# Patient Record
Sex: Female | Born: 1985 | Race: Black or African American | Hispanic: No | Marital: Single | Smoking: Current every day smoker
Health system: Southern US, Community
[De-identification: ages and names within clinical notes are randomized; demographics above are authoritative.]

---

## 2014-09-23 ENCOUNTER — Emergency Department (HOSPITAL_COMMUNITY)
Admission: EM | Admit: 2014-09-23 | Discharge: 2014-09-23 | Disposition: A | Discharge status: Home / Self Care | Payer: Self-pay | Attending: Emergency Medicine | Admitting: Emergency Medicine

## 2014-09-23 ENCOUNTER — Encounter (HOSPITAL_COMMUNITY): Payer: Self-pay | Admitting: Emergency Medicine

## 2014-09-23 ENCOUNTER — Emergency Department (HOSPITAL_COMMUNITY): Payer: Self-pay

## 2014-09-23 DIAGNOSIS — Z3202 Encounter for pregnancy test, result negative: Secondary | ICD-10-CM | POA: Insufficient documentation

## 2014-09-23 DIAGNOSIS — R51 Headache: Secondary | ICD-10-CM | POA: Insufficient documentation

## 2014-09-23 DIAGNOSIS — J029 Acute pharyngitis, unspecified: Secondary | ICD-10-CM | POA: Insufficient documentation

## 2014-09-23 DIAGNOSIS — R0982 Postnasal drip: Secondary | ICD-10-CM | POA: Insufficient documentation

## 2014-09-23 DIAGNOSIS — N390 Urinary tract infection, site not specified: Secondary | ICD-10-CM | POA: Insufficient documentation

## 2014-09-23 LAB — COMPREHENSIVE METABOLIC PANEL
ALT: 9 U/L (ref 0–35)
ANION GAP: 11 (ref 5–15)
AST: 13 U/L (ref 0–37)
Albumin: 2.7 g/dL — ABNORMAL LOW (ref 3.5–5.2)
Alkaline Phosphatase: 64 U/L (ref 39–117)
BUN: 7 mg/dL (ref 6–23)
CALCIUM: 7.6 mg/dL — AB (ref 8.4–10.5)
CHLORIDE: 103 meq/L (ref 96–112)
CO2: 21 meq/L (ref 19–32)
Creatinine, Ser: 1 mg/dL (ref 0.50–1.10)
GFR calc Af Amer: 87 mL/min — ABNORMAL LOW (ref >90)
GFR, EST NON AFRICAN AMERICAN: 75 mL/min — AB (ref >90)
Glucose, Bld: 94 mg/dL (ref 70–99)
Potassium: 3.4 mEq/L — ABNORMAL LOW (ref 3.7–5.3)
Sodium: 135 mEq/L — ABNORMAL LOW (ref 137–147)
Total Bilirubin: 0.2 mg/dL — ABNORMAL LOW (ref 0.3–1.2)
Total Protein: 6.2 g/dL (ref 6.0–8.3)

## 2014-09-23 LAB — CBC WITH DIFFERENTIAL/PLATELET
BASOS PCT: 0 % (ref 0–1)
Basophils Absolute: 0 10*3/uL (ref 0.0–0.1)
EOS PCT: 1 % (ref 0–5)
Eosinophils Absolute: 0.2 10*3/uL (ref 0.0–0.7)
HEMATOCRIT: 34.2 % — AB (ref 36.0–46.0)
HEMOGLOBIN: 11.1 g/dL — AB (ref 12.0–15.0)
Lymphocytes Relative: 9 % — ABNORMAL LOW (ref 12–46)
Lymphs Abs: 2.1 10*3/uL (ref 0.7–4.0)
MCH: 30.4 pg (ref 26.0–34.0)
MCHC: 32.5 g/dL (ref 30.0–36.0)
MCV: 93.7 fL (ref 78.0–100.0)
MONOS PCT: 8 % (ref 3–12)
Monocytes Absolute: 2 10*3/uL — ABNORMAL HIGH (ref 0.1–1.0)
NEUTROS ABS: 20.6 10*3/uL — AB (ref 1.7–7.7)
Neutrophils Relative %: 82 % — ABNORMAL HIGH (ref 43–77)
Platelets: 318 10*3/uL (ref 150–400)
RBC: 3.65 MIL/uL — ABNORMAL LOW (ref 3.87–5.11)
RDW: 13.5 % (ref 11.5–15.5)
WBC: 24.9 10*3/uL — ABNORMAL HIGH (ref 4.0–10.5)

## 2014-09-23 LAB — URINALYSIS, ROUTINE W REFLEX MICROSCOPIC
Glucose, UA: 100 mg/dL — AB
KETONES UR: 40 mg/dL — AB
Nitrite: POSITIVE — AB
PROTEIN: 100 mg/dL — AB
Specific Gravity, Urine: 1.016 (ref 1.005–1.030)
Urobilinogen, UA: 2 mg/dL — ABNORMAL HIGH (ref 0.0–1.0)
pH: 5 (ref 5.0–8.0)

## 2014-09-23 LAB — PREGNANCY, URINE: PREG TEST UR: NEGATIVE

## 2014-09-23 LAB — RAPID STREP SCREEN (MED CTR MEBANE ONLY): Streptococcus, Group A Screen (Direct): NEGATIVE

## 2014-09-23 LAB — URINE MICROSCOPIC-ADD ON

## 2014-09-23 MED ORDER — CIPROFLOXACIN HCL 500 MG PO TABS: 500.0000 mg | ORAL_TABLET | Freq: Two times a day (BID) | ORAL | Status: AC

## 2014-09-23 MED ORDER — HYDROCODONE-ACETAMINOPHEN 5-325 MG PO TABS: 2.0000 | ORAL_TABLET | ORAL | Status: AC | PRN

## 2014-09-23 MED ORDER — KETOROLAC TROMETHAMINE 30 MG/ML IJ SOLN
30.0000 mg | Freq: Once | INTRAMUSCULAR | Status: AC
  Administered 2014-09-23: 30 mg via INTRAVENOUS
  Filled 2014-09-23: qty 1

## 2014-09-23 MED ORDER — DEXTROSE 5 % IV SOLN
1.0000 g | Freq: Once | INTRAVENOUS | Status: AC
  Administered 2014-09-23: 1 g via INTRAVENOUS
  Filled 2014-09-23: qty 10

## 2014-09-23 MED ORDER — SODIUM CHLORIDE 0.9 % IV SOLN
Freq: Once | INTRAVENOUS | Status: AC
  Administered 2014-09-23: 13:00:00 via INTRAVENOUS

## 2014-09-23 MED ORDER — METOCLOPRAMIDE HCL 5 MG/ML IJ SOLN
10.0000 mg | Freq: Once | INTRAMUSCULAR | Status: AC
  Administered 2014-09-23: 10 mg via INTRAVENOUS
  Filled 2014-09-23: qty 2

## 2014-09-23 MED ORDER — DIPHENHYDRAMINE HCL 50 MG/ML IJ SOLN
25.0000 mg | Freq: Once | INTRAMUSCULAR | Status: AC
  Administered 2014-09-23: 25 mg via INTRAVENOUS
  Filled 2014-09-23: qty 1

## 2014-09-23 NOTE — Progress Notes (Signed)
  CARE MANAGEMENT ED NOTE 09/23/2014  Patient:  Snelgrove,Chanci   Account Number:  [id number]  Date Initiated:  09/23/2014  Documentation initiated by:  Radford PaxFERRERO,Carsin Randazzo  Subjective/Objective Assessment:   Patient presents to Ed with neck pain, nasal congestion and headache     Subjective/Objective Assessment Detail:     Action/Plan:   Action/Plan Detail:   Anticipated DC Date:  09/23/2014     Status Recommendation to Physician:   Result of Recommendation:    Other ED Services  Consult Working Plan    DC Planning Services  Other  PCP issues    Choice offered to / List presented to:            Status of service:  Completed, signed off  ED Comments:   ED Comments Detail:  EDCM spoke to patient at bedside. Patient confirms she does not have a pcp or insurance [place] in [place]. EDCM provide patient with pamphlet to [facility], informed patient of services there and walk in times.  EDCM also provided patient with list of pcps who accept self pay patients, list of discount pharmacies and websites needymeds.org and GoodRX.com for medication assistance, phone number to inquire about the orange card, phone number to inquire about Mediciad, phone number to inquire about the Affordable Care Act, financial resources in the community such as local churches, salvation army, urban ministries, and dental assistance for uninsured patients. Patient thankfulf or resources.  No further EDCM needs at this time.  Referral to [facility] made.

## 2014-09-23 NOTE — ED Provider Notes (Signed)
Medical screening examination/treatment/procedure(s) were performed by non-physician practitioner and as supervising physician I was immediately available for consultation/collaboration.   EKG Interpretation None        Shon Batonourtney F Horton, MD 09/23/14 1902

## 2014-09-23 NOTE — ED Notes (Signed)
Patient transported to X-ray 

## 2014-09-23 NOTE — Discharge Instructions (Signed)
Urinary Tract Infection °Urinary tract infections (UTIs) can develop anywhere along your urinary tract. Your urinary tract is your body's drainage system for removing wastes and extra water. Your urinary tract includes two kidneys, two ureters, a bladder, and a urethra. Your kidneys are a pair of bean-shaped organs. Each kidney is about the size of your fist. They are located below your ribs, one on each side of your spine. °CAUSES °Infections are caused by microbes, which are microscopic organisms, including fungi, viruses, and bacteria. These organisms are so small that they can only be seen through a microscope. Bacteria are the microbes that most commonly cause UTIs. °SYMPTOMS  °Symptoms of UTIs may vary by age and gender of the patient and by the location of the infection. Symptoms in young women typically include a frequent and intense urge to urinate and a painful, burning feeling in the bladder or urethra during urination. Older women and men are more likely to be tired, shaky, and weak and have muscle aches and abdominal pain. A fever may mean the infection is in your kidneys. Other symptoms of a kidney infection include pain in your back or sides below the ribs, nausea, and vomiting. °DIAGNOSIS °To diagnose a UTI, your caregiver will ask you about your symptoms. Your caregiver also will ask to provide a urine sample. The urine sample will be tested for bacteria and white blood cells. White blood cells are made by your body to help fight infection. °TREATMENT  °Typically, UTIs can be treated with medication. Because most UTIs are caused by a bacterial infection, they usually can be treated with the use of antibiotics. The choice of antibiotic and length of treatment depend on your symptoms and the type of bacteria causing your infection. °HOME CARE INSTRUCTIONS °· If you were prescribed antibiotics, take them exactly as your caregiver instructs you. Finish the medication even if you feel better after you  have only taken some of the medication. °· Drink enough water and fluids to keep your urine clear or pale yellow. °· Avoid caffeine, tea, and carbonated beverages. They tend to irritate your bladder. °· Empty your bladder often. Avoid holding urine for long periods of time. °· Empty your bladder before and after sexual intercourse. °· After a bowel movement, women should cleanse from front to back. Use each tissue only once. °SEEK MEDICAL CARE IF:  °· You have back pain. °· You develop a fever. °· Your symptoms do not begin to resolve within 3 days. °SEEK IMMEDIATE MEDICAL CARE IF:  °· You have severe back pain or lower abdominal pain. °· You develop chills. °· You have nausea or vomiting. °· You have continued burning or discomfort with urination. °MAKE SURE YOU:  °· Understand these instructions. °· Will watch your condition. °· Will get help right away if you are not doing well or get worse. °Document Released: 09/01/2005 Document Revised: 05/23/2012 Document Reviewed: 12/31/2011 °ExitCare® Patient Information ©2015 [employer]. This information is not intended to replace advice given to you by your health care provider. Make sure you discuss any questions you have with your health care provider. ° ° °Emergency Department Resource Guide °1) Find a Doctor and Pay Out of Pocket °Although you won't have to find out who is covered by your insurance plan, it is a good idea to ask around and get recommendations. You will then need to call the office and see if the doctor you have chosen will accept you as a new patient and what types of   options they offer for patients who are self-pay. Some doctors offer discounts or will set up payment plans for their patients who do not have insurance, but you will need to ask so you aren't surprised when you get to your appointment. ° °2) Contact Your Local Health Department °Not all health departments have doctors that can see patients for sick visits, but many do, so it is worth  a call to see if yours does. If you don't know where your local health department is, you can check in your phone book. The CDC also has a tool to help you locate your state's health department, and many state websites also have listings of all of their local health departments. ° °3) Find a Walk-in Clinic °If your illness is not likely to be very severe or complicated, you may want to try a walk in clinic. These are popping up all over the country in pharmacies, drugstores, and shopping centers. They're usually staffed by nurse practitioners or physician assistants that have been trained to treat common illnesses and complaints. They're usually fairly quick and inexpensive. However, if you have serious medical issues or chronic medical problems, these are probably not your best option. ° °No Primary Care Doctor: °- Call Health Connect at  832-8000 - they can help you locate a primary care doctor that  accepts your insurance, provides certain services, etc. °- Physician Referral Service- 1-800-533-3463 ° °Chronic Pain Problems: °Organization         Address  Phone   Notes  °[facility]  (336) 297-2271 Patients need to be referred by their primary care doctor.  ° °Medication Assistance: °Organization         Address  Phone   Notes  °[place] [facility] [address] °[place], [place] [address] (336) 641-8030 --Must be a resident of [place] °-- Must have NO insurance coverage whatsoever (no Medicaid/ Medicare, etc.) °-- The pt. MUST have a primary care doctor that directs their care regularly and follows them in the community °  °[facility]  (866) 331-1348   °[employer]  (888) 892-1162   ° °Agencies that provide inexpensive medical care: °Organization         Address  Phone   Notes  °[facility]  (336) 832-8035   °[facility]    (336) 832-7272   °[facility] [address] °[place], [place]  [address] (336) 832-4777   °[facility] [address], °[place] (336) 271-4999   °[facility]    (336) 373-0678   °[facility]    (336) 272-1050   °[facility] ° [address], [place] Phone:  (336) 832-4444, Fax:  (336) 832-4440 Hours of Operation:  9 am - 6 pm, M-F.  Also accepts Medicaid/Medicare and self-pay.  °[facility] ° [address], [place] Phone: (336) 832-3150, Fax: (336) 832-3151. Hours of Operation:  8:30 am - 5:30 pm, M-F.  Also accepts Medicaid and self-pay.  °[facility] [place] [address], [place] Phone: (336) 878-6027   °[facility] [address], [place],  (336)723-1848, Ext. 123 Mondays & Thursdays: 7-9 AM.  First 15 patients are seen on a first come, first serve basis. °  ° °Medicaid-accepting [place] Providers: ° °Organization         Address  Phone   Notes  °  [facility] [address], [place] (336) 641-2100 Also accepts self-pay patients.  °[facility] [address], [place] ° (336) 856-9996   °[facility] [address], [place] (336) 288-8857   °[facility] [address] [place] [address], [place] (336) 299-7000   °Veita Bland [address], [place]  ° (336) 373-1557 Only accepts [place] Access Medicaid patients after they have their name applied to their card.  ° °Self-Pay (no insurance) in [place]: ° °Organization         Address  Phone   Notes  °Sickle Cell Patients, [facility] [address], [place] (336) 832-1970   °[facility] [address], [place] (336) 832-4400   ° [facility] ° [address], [place] (336) 992-4800   °[facility]/Dr. Osei-Bonsu ° [address] [place] [address], [place] or [address], [place] (336) 841-8500 Phone number for both [place] and [place] locations is the same.  °[facility] [address], [place] (336) 299-0000   °[facility] [address] [place] [address], [place] or [address] (336) 852-7530 °(336) 878-2260   °[facility] [address], [place] (336) 350-1642, phone; (336) 294-5005, fax Sees patients 1st and 3rd Saturday of every month.  Must not qualify for public or private insurance (i.e. Medicaid, Medicare, [place] Health Choice, Veterans' Benefits) • Household income should be no more than 200% of the poverty level •The clinic cannot treat you if you are pregnant or think you are pregnant • Sexually transmitted diseases are not treated at the clinic.  ° ° °Dental Care: °Organization         Address  Phone  Notes  °[place] [facility] [address], [place] (336) 641-6152 Accepts children up to age 21 who are enrolled in Medicaid or [place] Health Choice; pregnant women with a Medicaid card; and children who have applied for Medicaid or [place] Health Choice, but were declined, whose parents can pay a reduced fee at time of service.  °[place] [facility] [place]  [address], [place] (336) 641-7733 Accepts children up to age 21 who are enrolled in Medicaid or [place] Health Choice; pregnant women with a Medicaid card; and children who have applied for Medicaid or [place] Health Choice, but were declined, whose parents can pay a reduced fee at time of service.  °[facility] ° [address],  (336) 641-4533 Patients are seen by appointment only. Walk-ins are not accepted. [facility] will see patients 18 years of age and older. °Monday - Tuesday (8am-5pm) °Most Wednesdays (8:30-5pm) °$30 per visit, cash only  °[facility] ° [address], [place] (336) 641-4533  Patients are seen by appointment only. Walk-ins are not accepted. [facility] will see patients 18 years of age and older. °One Wednesday Evening (Monthly: Volunteer Based).  $30 per visit, cash only  °[facility]  (919) 537-3737 for adults; Children under age 4, call Graduate Pediatric Dentistry at (919) [phone]. Children aged 4-14, please call (919) 537-3737 to request a pediatric application. ° Dental services are provided in all areas of dental care including fillings, crowns and bridges, complete and partial   dentures, implants, gum treatment, root canals, and extractions. Preventive care is also provided. Treatment is provided to both adults and children. °Patients are selected via a lottery and there is often a waiting list. °  °[facility] [address], °[place] ° (336) 763-8833 www.drcivils.com °  °[facility] [address], [place], [place] (336)723-1848, Ext. 123 Second and Fourth Thursday of each month, opens at 6:30 AM; Clinic ends at 9 AM.  Patients are seen on a first-come first-served basis, and a limited number are seen during each clinic.  ° °[facility] ° [address], [place], [place] (336) 723-7904   Eligibility Requirements °You must have lived in [place], or [place] counties for at least the last three months. °  You cannot be eligible for state or federal sponsored healthcare insurance, including Veterans Administration, Medicaid, or Medicare. °  You generally cannot be eligible for healthcare insurance through your employer.  °  How to apply: °Eligibility screenings are held every Tuesday and Wednesday afternoon from 1:00 pm until 4:00 pm. You do not need an appointment for the interview!  °[facility] [address], [place], [place] 336-631-2330   °[facility] [facility]  336-342-8273   °[facility]  336-703-3100   °[facility]   336-570-6415   ° °Behavioral Health Resources in the Community: °Intensive Outpatient Programs °Organization         Address  Phone  Notes  °[facility] [address], [place], [place] 336-878-6098   °[facility] [address], [place], [place] 336-832-9800   °[facility] [address], [place], [place] ° 336-882-2125   °[facility] [address],  °[place], [place] 1-800-853-5163 or 336-641-4981   °Substance Abuse Resources °Organization         Address  Phone  Notes  °[facility]  336-882-2125   °[facility]  336-784-9470   °[facility]  336-285-9073   °[facility]  336-845-3988   °Residential & Outpatient Substance Abuse Program  1-800-659-3381   °Psychological Services °Organization         Address  Phone  Notes  °[facility]  336- 832-9600   °[facility]  336- 378-7881   °[facility] [address], [place] 1-800-853-5163 or 336-641-4981   ° °Mobile Crisis Teams °Organization         Address  Phone  Notes  °[facility], Mobile Crisis Care Unit  1-877-626-1772   °Assertive °[facility] ° [address] [place], [place] 336-834-9664   °Sharon DeEsch [address] °[place] 336-554-5454   ° °Self-Help/Support Groups °Organization         Address  Phone             Notes  °[facility] - variety of support groups  336- 373-1402 Call for more information  °Narcotics Anonymous (NA), [facility] [address], °[place]  2 meetings at this location  ° °Residential Treatment Programs °Organization         Address  Phone  Notes  °[facility] [address],    °[place]   1-866-801-8205   °[facility] ° [address], [place],  704-293-8524   °[facility] [address], [place] 336-845-3988 Admissions: 8am-3pm M-F   °  [facility] [address]    °[place], [place] 336-841-1104   °[facility] [address], [place], [place] 336-379-7146   °[facility] [address]  °[place], [place] 336-285-9073   °[facility] Intensive Outpatient [address], [place], [place] 336-852-3033   °[facility] [address]  °[place], [place] 1-877-615-2722 or 336-784-9470   °[facility] [address] [place], [place] 336-227-7417 Accepts Medicaid  °[facility] [address]  °[place] 1-800-659-3381 Substance Abuse/Addiction Treatment  ° °[place] Behavioral Health Resources °Organization         Address  Phone  Notes  °[facility]  (888) 581-9988   °Julie Brannon, PhD [address] [place], [place]   (336) 349-5553 or (336) 951-0000   °[facility]   [address] °[place], [place] (336) 349-4454   °[facility] [address], [place], [place] (336) 342-8316 Insurance/Medicaid/sponsorship through [facility]  °[facility] [address]                                    [place], [place] (336) 342-8316 Therapy/tele-psych/case  °[facility] [address]  ° [place], [place] (336) 349-2233    °Dr. Arfeen  (336) 349-4544   °[facility] [place]  [employer] [place] [facility] 1) [address], [place] °2) [address], [place] °3)  [address], [place] (336) 349-3220 °(336) 342-7768 ° °(336) 342-8140   °[place] [facility] (336) 342-1394 or (336) 342-3537 (After Hours)    ° ° ° ° °

## 2014-09-23 NOTE — ED Provider Notes (Signed)
CSN: [id number]     Arrival date & time 09/23/14  1140 History   First MD Initiated Contact with Patient 09/23/14 1234     Chief Complaint  Patient presents with  . Neck Pain  . Headache  . Nasal Congestion     (Consider location/radiation/quality/duration/timing/severity/associated sxs/prior Treatment) Patient is a 28 y.o. female presenting with headaches. The history is provided by the patient. No language interpreter was used.  Headache Pain location:  Generalized Quality:  Stabbing Radiates to:  Does not radiate Severity currently:  6/10 Severity at highest:  6/10 Onset quality:  Gradual Duration:  1 day Timing:  Constant Progression:  Worsening Chronicity:  New Similar to prior headaches: yes   Context: not activity   Relieved by:  Nothing Worsened by:  Nothing tried Ineffective treatments:  None tried Associated symptoms: cough, drainage and sore throat   Associated symptoms: no abdominal pain   Risk factors: no anger     History reviewed. No pertinent past medical history. History reviewed. No pertinent past surgical history. No family history on file. History  Substance Use Topics  . Smoking status: Current Every Day Smoker  . Smokeless tobacco: Not on file  . Alcohol Use: No   OB History   Grav Para Term Preterm Abortions TAB SAB Ect Mult Living                 Review of Systems  HENT: Positive for postnasal drip and sore throat.   Respiratory: Positive for cough.   Gastrointestinal: Negative for abdominal pain.  Neurological: Positive for headaches.  All other systems reviewed and are negative.     Allergies  Review of patient's allergies indicates no known allergies.  Home Medications   Prior to Admission medications   Medication Sig Start Date End Date Taking? Authorizing Provider  Pseudoeph-Doxylamine-DM-APAP (NYQUIL PO) Take 2 capsules by mouth daily as needed (congestion).   Yes Historical Provider, MD   BP 143/103  Pulse 114   Temp(Src) 99.7 F (37.6 C) (Oral)  Resp 20  SpO2 98%  LMP 09/23/2014 Physical Exam  Nursing note and vitals reviewed. Constitutional: She is oriented to person, place, and time. She appears well-developed and well-nourished.  HENT:  Head: Normocephalic and atraumatic.  Eyes: Conjunctivae and EOM are normal. Pupils are equal, round, and reactive to light.  Neck: Normal range of motion.  Cardiovascular: Normal rate, regular rhythm and normal heart sounds.   Pulmonary/Chest: Effort normal.  Abdominal: Soft. She exhibits no distension.  Musculoskeletal: Normal range of motion.  Neurological: She is alert and oriented to person, place, and time.  Skin: Skin is warm.  Psychiatric: She has a normal mood and affect.    ED Course  Procedures (including critical care time) Labs Review Labs Reviewed - No data to display  Imaging Review No results found.   EKG Interpretation None      MDM   Final diagnoses:  UTI (lower urinary tract infection)    IV Rocephin Rx cipro/hydrocodone Resource guide 24 hour recheck if not improved     Elson AreasLeslie K Sofia, PA-C 09/23/14 1530  Lonia SkinnerLeslie K BlandvilleSofia, PA-C 09/23/14 1530

## 2014-09-23 NOTE — ED Notes (Signed)
Per pt, states flu like symptoms which started yesterday-neck pain with headache-nasal congestion/drainage-achey all over

## 2014-09-24 LAB — URINE CULTURE
Colony Count: NO GROWTH
Culture: NO GROWTH
Special Requests: NORMAL

## 2014-09-25 LAB — CULTURE, GROUP A STREP

## 2016-09-23 IMAGING — CR DG CHEST 2V
2 series · 2 of 2 positions shown · non-contrast
Comparison: None.

CLINICAL DATA: Initial encounter for 1 week history of left
shoulder pain without injury.

EXAM:
CHEST  2 VIEW

[w chest pa]
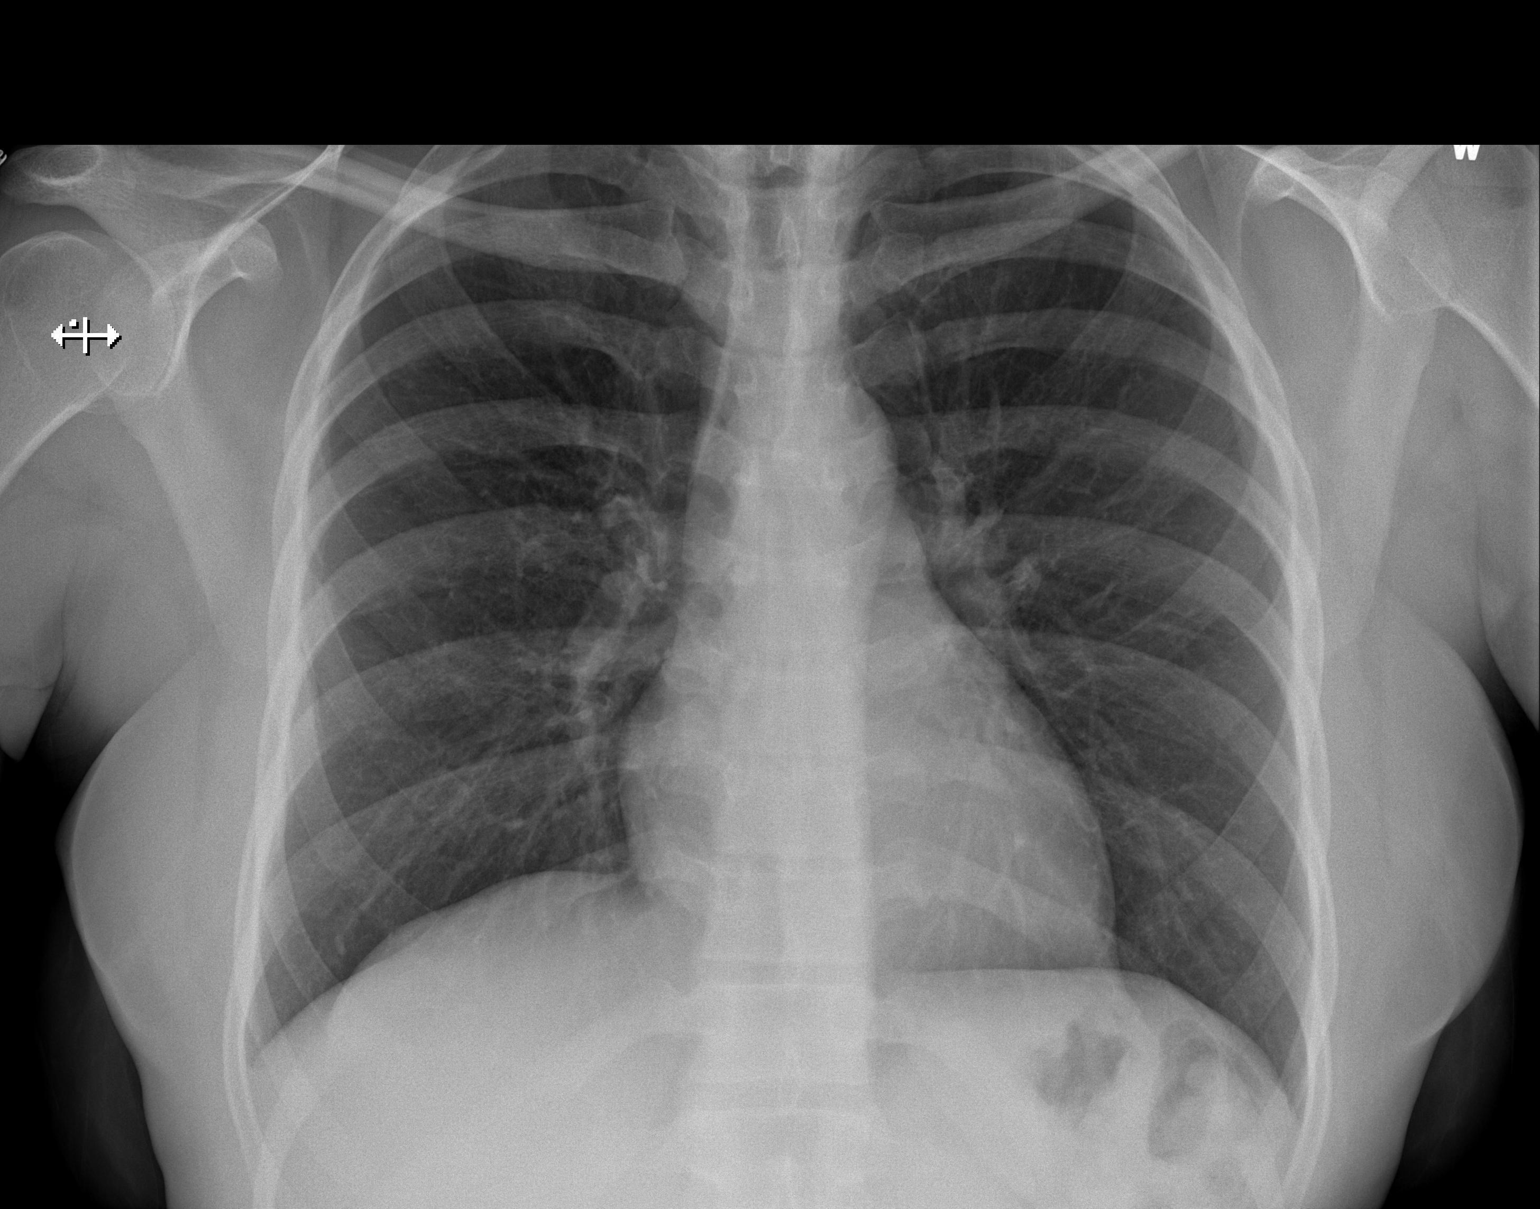

[w chest lat]
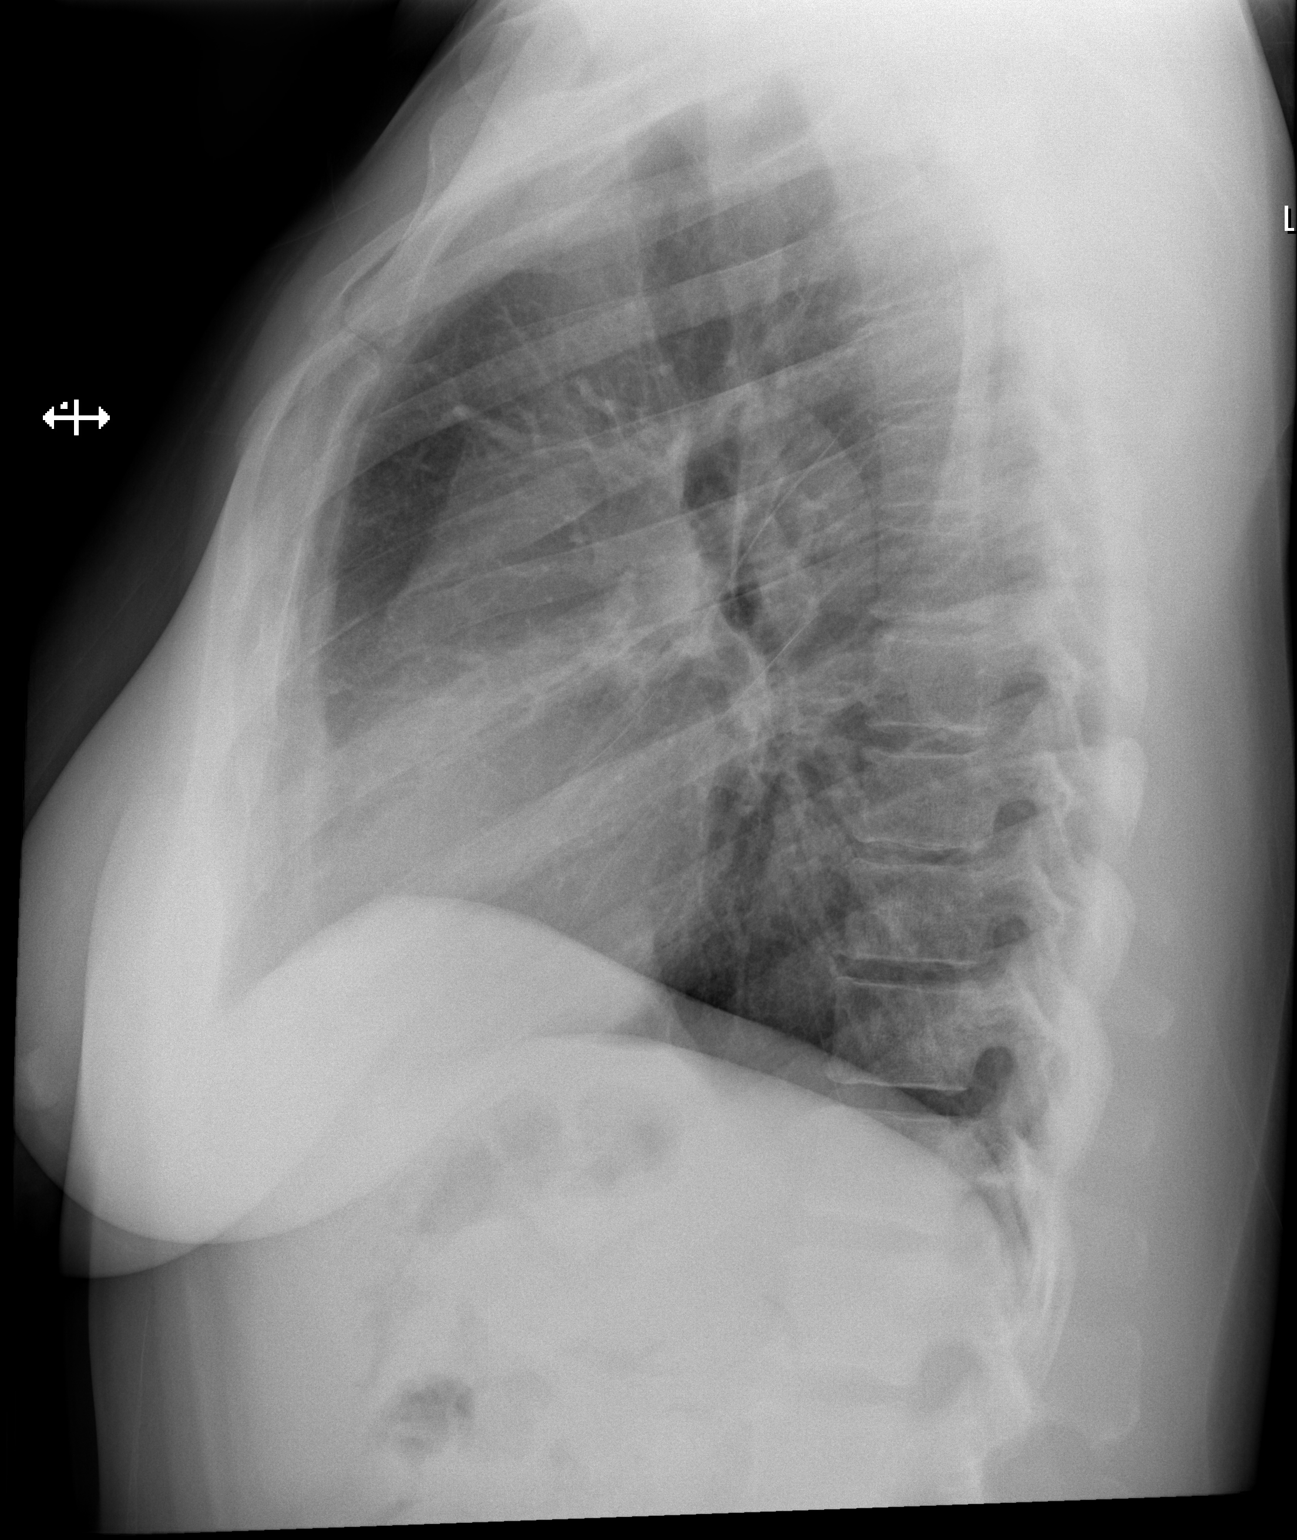

[2 of 2 positions shown; findings below may reference images not displayed]

FINDINGS: The heart size and mediastinal contours are within normal limits.
Both lungs are clear. The visualized skeletal structures are
unremarkable.
IMPRESSION: No active cardiopulmonary disease.
